# Patient Record
Sex: Male | Born: 1965 | Race: White | Hispanic: No | Marital: Married | Smoking: Never smoker
Health system: Southern US, Community
[De-identification: ages and names within clinical notes are randomized; demographics above are authoritative.]

## PROBLEM LIST (undated history)

## (undated) DIAGNOSIS — H409 Unspecified glaucoma: Secondary | ICD-10-CM

## (undated) DIAGNOSIS — N189 Chronic kidney disease, unspecified: Secondary | ICD-10-CM

## (undated) HISTORY — DX: Unspecified glaucoma: H40.9

## (undated) HISTORY — PX: TONSILLECTOMY: SUR1361

---
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
## 1999-08-20 ENCOUNTER — Encounter: Admission: RE | Admit: 1999-08-20 | Discharge: 1999-08-20 | Payer: Self-pay | Admitting: Neurosurgery

## 1999-08-20 ENCOUNTER — Encounter: Payer: Self-pay | Admitting: Neurosurgery

## 1999-08-26 ENCOUNTER — Ambulatory Visit (HOSPITAL_COMMUNITY): Admission: RE | Admit: 1999-08-26 | Discharge: 1999-08-27 | Payer: Self-pay | Admitting: Neurosurgery

## 2007-09-06 ENCOUNTER — Emergency Department (HOSPITAL_COMMUNITY): Admission: EM | Admit: 2007-09-06 | Discharge: 2007-09-06 | Payer: Self-pay | Admitting: Emergency Medicine

## 2010-08-13 NOTE — Op Note (Signed)
[place]  Patient:    Clinton Hanna, Clinton Hanna                        MRN: [id number] Proc. Date: 08/26/99 Adm. Date:  [id number] Disc. Date: [id number] Attending:  Jackelyn Knife                           Operative Report  PREOPERATIVE DIAGNOSIS:  Ventriculoperitoneal shunt malfunction.  POSTOPERATIVE DIAGNOSIS:  Ventriculoperitoneal shunt malfunction.  OPERATION PERFORMED:  Shunt revision.  SURGEON:  Izell Climax. Elesa Hacker, M.D.  ASSISTANT:  Clydene Fake, M.D.  ANESTHESIA:  General endotracheal.  DESCRIPTION OF PROCEDURE:  Under general endotracheal anesthesia, this man was positioned supine on the operating table.  It was elected to prep as though the entire shunt mechanism might need to be replaced and this was done.  The procedure was started with an exploration of a connector site for the peritoneal catheter since it had been apparently disconnected here as seen on preliminary x-rays.  The old proximal portion shunt tubing was easily found and indeed was noted to be disconnected from the distal peritoneal catheter. Initially, it was felt that there was some question as to whether there was adequate CSF flow, so the pumping mechanism and bur hole area were re-exposed and scar tissue was removed and the mechanisms were then shown to work properly with good CSF flow.  This incision was then closed.  Distally, a second incision was made allowing access to the peritoneal cavity. Dissection was carried down through subcutaneous tissues and fascial layers to expose peritoneum.  Particular care was taken to be sure that the entrance was indeed into the peritoneal cavity, not into preperitoneal fat and fascia.  The new peritoneal catheter was then attached to the tunneling connector with a 0 silk tie.  The new catheter was then tunneled to the peritoneal incision and then placed into the peritoneum.  A 3-0 silk pursestring suture had been placed and  this was now snugged down.  The abdominal wounds were closed in layers and sterile dressings were applied throughout.  The patient was taken to the recovery room in good condition. DD:  08/26/99 TD:  08/30/99 Job: [id number]

## 2010-08-13 NOTE — H&P (Signed)
[place]  Patient:    Clinton Hanna, Clinton Hanna                        MRN: [id number] Adm. Date:  [id number] Attending:  Jackelyn Knife                         History and Physical  HISTORY OF PRESENT ILLNESS:  Clinton Hanna, Hommel. is now 45 years of age.  He has been a patient of mine for the last 20+ years because of a problem of hydrocephalus following a pineal region tumor.  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  He has had revisions of the distal portion of the shunt but the ventricular portion has stayed opened.  We have followed him with yearly scans and appointments.  He came in on Aug 20, 1999, with a problem of increased frequency to his headaches over the last two weeks.  This was clearly a change.  He also had had some numbness of his left hand.  He has had this in the past.  Examination on the 25th looked reasonably good.  We did go ahead with repeat CT at that point and this showed progression of his hydrocephalus.  A shunt series of x-rays were done and this showed discontinuity of the peritoneal portion of the shunt where the tubing enters the abdomen.  He is admitted at this time for shunt revision.  PAST MEDICAL HISTORY:  His past medical history is pertinent as mentioned above.  FAMILY HISTORY:  Indicates that his mother and father are both living and well.  His father has also been a patient of ours.  PAST SURGICAL HISTORY:  Previous surgery includes shunt revision in the distal portion of the shunt on two or three occasions.  He has had a problem with the plastic eye as mentioned.  CURRENT MEDICATIONS:  He has been taking some analgesics.  REVIEW OF SYSTEMS:  Pertinent as already mentioned.  PHYSICAL EXAMINATION:  GENERAL:   On examination he is alert and cooperative.  He has gained a bit of weight and is slightly overweight at this time.  HEENT:  Examination of the head reveals the presence of a left ocular prosthesis.  The shunt mechanism is palpable and the right parietal occipital area.  CHEST:  Clear.  CARDIAC:  Examination is normal.  ABDOMEN:  Obese, soft, and nontender.  He does have surgical scars from shunt placement.  NEUROLOGICAL:  Examination at this time reveals sensorium and cranial nerves to be intact, and I had some difficulty seeing the margin in his right eye, but I think this had to do with pupillary size.  Strength is good and reflex and gait coordination are normal.  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   DD:  08/26/99 TD:  08/27/99 Job: [id number]

## 2010-12-23 LAB — URINALYSIS, ROUTINE W REFLEX MICROSCOPIC
Bilirubin Urine: NEGATIVE
Glucose, UA: NEGATIVE
Hgb urine dipstick: NEGATIVE
Ketones, ur: NEGATIVE
Nitrite: NEGATIVE
Protein, ur: NEGATIVE
Specific Gravity, Urine: 1.03
Urobilinogen, UA: 1
pH: 6.5

## 2010-12-23 LAB — CBC
HCT: 45
Hemoglobin: 15
MCHC: 33.4
MCV: 91.1
Platelets: 207
RBC: 4.94
RDW: 13.4
WBC: 10

## 2012-09-12 ENCOUNTER — Emergency Department: Payer: Self-pay

## 2012-09-12 LAB — COMPREHENSIVE METABOLIC PANEL
Albumin: 3.6 g/dL (ref 3.4–5.0)
Alkaline Phosphatase: 73 U/L (ref 50–136)
Anion Gap: 2 — ABNORMAL LOW (ref 7–16)
BUN: 13 mg/dL (ref 7–18)
Bilirubin,Total: 0.5 mg/dL (ref 0.2–1.0)
Calcium, Total: 8.6 mg/dL (ref 8.5–10.1)
Chloride: 108 mmol/L — ABNORMAL HIGH (ref 98–107)
EGFR (Non-African Amer.): >60
Glucose: 103 mg/dL — ABNORMAL HIGH (ref 65–99)
Osmolality: 278 (ref 275–301)
Potassium: 4 mmol/L (ref 3.5–5.1)
SGOT(AST): 21 U/L (ref 15–37)
Sodium: 139 mmol/L (ref 136–145)

## 2012-09-12 LAB — CBC
MCHC: 33.7 g/dL (ref 32.0–36.0)
MCV: 89 fL (ref 80–100)
RDW: 13.9 % (ref 11.5–14.5)
WBC: 6.2 10*3/uL (ref 3.8–10.6)

## 2014-07-18 NOTE — Consult Note (Signed)
PATIENT NAME:  Clinton Hanna, Witt MR#:  [id number] DATE OF BIRTH:  02-01-1966  DATE OF CONSULTATION:  09/12/2012  CONSULTING PHYSICIAN:  Hoang Reich A. Allena KatzPatel, MD PRIMARY CARE PHYSICIAN: Unknown   CHIEF COMPLAINT: Right-sided headache with blurred vision today.   HISTORY OF PRESENT ILLNESS:  Mr. Clinton Hanna is a 49 year old Caucasian gentleman who has history of VP shunt since 1978 when he was diagnosed with a pineal tumor. He had undergone a VP shunt placement and thereafter procedures for his shunt blockage several times in the past.   He comes in today after he started having a right-sided headache and associated with some "fuzzy blurred vision." The patient said he felt some numbness in his right upper extremity.  He denies any slurred speech or any difficulty swallowing or any weakness in his arms or legs. He came to the Emergency Room, received aspirin 324 mg p.o. once and a Toradol injection. The patient states his headache is resolved. His vision is stable, and his vision is at baseline, and his right arm numbness is also resolved. The patient is hemodynamically otherwise stable, denies any nausea or vomiting.   PAST MEDICAL HISTORY: 1.  History of pineal tumor, status post VP shunt in 1978. The patient follows up with [facility] [address] in [place].  2.  Left prosthetic eye.   ALLERGIES: No known drug allergies.   FAMILY HISTORY: Grandmother with stroke.   SOCIAL HISTORY: Married, nonsmoker, nonalcoholic.  Denies any other drug use.   REVIEW OF SYSTEMS:  CONSTITUTIONAL: No fever, fatigue, weakness.  EYES: The patient did have blurred vision initially, now resolved. No glaucoma or cataracts.  ENT: No tinnitus, ear pain, hearing loss.  RESPIRATORY: No cough, wheeze, hemoptysis.  CARDIOVASCULAR: No chest pain, orthopnea, edema or hypertension.   GASTROINTESTINAL: No nausea, vomiting, diarrhea, abdominal pain or hematemesis.  GENITOURINARY: No dysuria or hematuria.  ENDOCRINE:  No polyuria, nocturia or thyroid problems.  HEMATOLOGY: No anemia or easy bruising.  SKIN: No acne or rash.  MUSCULOSKELETAL: No arthritis or gout. NEUROLOGICAL: No CVA, TIA, no dysarthria.  PSYCHIATRIC: No anxiety or depression.  All other systems reviewed and negative.   PHYSICAL EXAMINATION: GENERAL: The patient is awake, alert, oriented x 3, not in acute distress.  VITAL SIGNS: Afebrile, pulse is 81, blood pressure is 113/65, sats are 96% on room air.  HEENT: Atraumatic, normocephalic. PERRLA on right eye. Left eye is prosthetic. Oral mucosa is moist. No facial droop.  NECK: Supple. No JVD. No carotid bruit.  RESPIRATORY: Clear to auscultation bilaterally. No rales, rhonchi, respiratory distress or labored breathing.  CARDIOVASCULAR: Both the heart sounds are normal. Rate, rhythm regular. PMI not lateralized. Chest is nontender.  EXTREMITIES: Good pedal pulses, good femoral pulses. No lower extremity edema.  ABDOMEN: Soft, benign, nontender. No organomegaly. Positive bowel sounds.  NEUROLOGIC: Cranial nerves II through XII appear intact. Motor and sensory exam appears within normal limits. The patient has good strength 5 out of 5 in all 4extremities. Sensory exam is normal. Gait is normal. Speech clear.  SKIN: Warm and dry.  PSYCHIATRIC: The patient is awake, alert, oriented x 3.   LABORATORY AND RADIOLOGICAL DATA:  CBC within normal limits except H and H of 12.7 and 37.7.  Comprehensive metabolic panel within normal limits.  CT head shows no evidence of acute focal abnormalities. Ventriculoperitoneal shunt is appreciated by a posterior parietal approach with tip projecting in the left lateral ventricle. Ventricles and cisterns are comprised.  No evidence of subfalcine or tonsillar herniation.  ASSESSMENT: [name] is a 49 year old with history of VP shunt in the past, comes in with:   1.  Right-sided headache with mild blurred vision and right hand numbness which is resolved after  the patient had aspirin 324 p.o. once and IV Toradol injection. No neuro deficits noted. CT head is negative for CVA. VP shunt appears intact without any evidence of ventriculomegaly. The patient does not have any risk factors of CVA other than family history of grandmother with CVA. It appears to be likely complex headache with blurred vision which is resolved after treatment in the Emergency Room. The patient denies any vomiting.   2.  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   PLAN:  1.  Discharge the patient to go home. He feels near baseline. The patient will continue baby aspirin daily. He will take p.r.n. Tylenol for headache, if needed.  2.  The patient was also recommended followup with his neurologist in [place], and he is recommended to return back to the Emergency Room or acute urgent care is signs and symptoms worsen.    The above was discussed with the patient's wife, who voiced understanding.   TIME SPENT: 45 minutes.   ____________________________ [name], MD sap:cb D: 09/12/2012 19:09:28 ET T: 09/12/2012 20:18:16 ET JOB#: [id number]  cc: Brelan Hannen A. Allena Katz, MD, <Dictator> Willow Ora MD ELECTRONICALLY SIGNED 09/19/2012 14:23

## 2014-09-12 DIAGNOSIS — N200 Calculus of kidney: Secondary | ICD-10-CM | POA: Diagnosis not present

## 2014-09-12 DIAGNOSIS — R109 Unspecified abdominal pain: Secondary | ICD-10-CM | POA: Diagnosis present

## 2014-09-13 ENCOUNTER — Emergency Department: Payer: BLUE CROSS/BLUE SHIELD

## 2014-09-13 ENCOUNTER — Encounter: Payer: Self-pay | Admitting: Emergency Medicine

## 2014-09-13 ENCOUNTER — Emergency Department
Admission: EM | Admit: 2014-09-13 | Discharge: 2014-09-13 | Disposition: A | Discharge status: Home / Self Care | Payer: BLUE CROSS/BLUE SHIELD | Attending: Emergency Medicine | Admitting: Emergency Medicine

## 2014-09-13 DIAGNOSIS — R52 Pain, unspecified: Secondary | ICD-10-CM

## 2014-09-13 DIAGNOSIS — N200 Calculus of kidney: Secondary | ICD-10-CM

## 2014-09-13 LAB — CBC WITH DIFFERENTIAL/PLATELET
BASOS PCT: 1 %
Basophils Absolute: 0 10*3/uL (ref 0–0.1)
Eosinophils Absolute: 0.1 10*3/uL (ref 0–0.7)
Eosinophils Relative: 1 %
HCT: 41 % (ref 40.0–52.0)
Hemoglobin: 13.4 g/dL (ref 13.0–18.0)
LYMPHS ABS: 2.3 10*3/uL (ref 1.0–3.6)
Lymphocytes Relative: 31 %
MCH: 30.1 pg (ref 26.0–34.0)
MCHC: 32.5 g/dL (ref 32.0–36.0)
MCV: 92.7 fL (ref 80.0–100.0)
MONOS PCT: 7 %
Monocytes Absolute: 0.5 10*3/uL (ref 0.2–1.0)
NEUTROS ABS: 4.6 10*3/uL (ref 1.4–6.5)
NEUTROS PCT: 60 %
Platelets: 206 10*3/uL (ref 150–440)
RBC: 4.43 MIL/uL (ref 4.40–5.90)
RDW: 14.2 % (ref 11.5–14.5)
WBC: 7.6 10*3/uL (ref 3.8–10.6)

## 2014-09-13 LAB — URINALYSIS COMPLETE WITH MICROSCOPIC (ARMC ONLY)
BACTERIA UA: NONE SEEN
Bilirubin Urine: NEGATIVE
Glucose, UA: NEGATIVE mg/dL
Ketones, ur: NEGATIVE mg/dL
LEUKOCYTES UA: NEGATIVE
Nitrite: NEGATIVE
PH: 5 (ref 5.0–8.0)
PROTEIN: NEGATIVE mg/dL
SQUAMOUS EPITHELIAL / LPF: NONE SEEN
Specific Gravity, Urine: 1.023 (ref 1.005–1.030)

## 2014-09-13 LAB — COMPREHENSIVE METABOLIC PANEL
ALBUMIN: 4.2 g/dL (ref 3.5–5.0)
ALK PHOS: 69 U/L (ref 38–126)
ALT: 27 U/L (ref 17–63)
ANION GAP: 5 (ref 5–15)
AST: 28 U/L (ref 15–41)
BUN: 20 mg/dL (ref 6–20)
CO2: 28 mmol/L (ref 22–32)
Calcium: 9 mg/dL (ref 8.9–10.3)
Chloride: 107 mmol/L (ref 101–111)
Creatinine, Ser: 1.21 mg/dL (ref 0.61–1.24)
GFR calc Af Amer: >60 mL/min (ref >60)
GFR calc non Af Amer: >60 mL/min (ref >60)
Glucose, Bld: 107 mg/dL — ABNORMAL HIGH (ref 65–99)
POTASSIUM: 3.6 mmol/L (ref 3.5–5.1)
SODIUM: 140 mmol/L (ref 135–145)
TOTAL PROTEIN: 7.7 g/dL (ref 6.5–8.1)
Total Bilirubin: 0.6 mg/dL (ref 0.3–1.2)

## 2014-09-13 LAB — TROPONIN I: Troponin I: <0.03 ng/mL (ref <0.031)

## 2014-09-13 MED ORDER — FENTANYL CITRATE (PF) 100 MCG/2ML IJ SOLN
INTRAMUSCULAR | Status: AC
  Administered 2014-09-13: 50 ug via INTRAVENOUS
  Filled 2014-09-13: qty 2

## 2014-09-13 MED ORDER — TAMSULOSIN HCL 0.4 MG PO CAPS: 0.4000 mg | ORAL_CAPSULE | Freq: Every day | ORAL | Status: DC

## 2014-09-13 MED ORDER — ONDANSETRON HCL 4 MG/2ML IJ SOLN
4.0000 mg | Freq: Once | INTRAMUSCULAR | Status: AC
  Administered 2014-09-13: 4 mg via INTRAVENOUS

## 2014-09-13 MED ORDER — OXYCODONE-ACETAMINOPHEN 5-325 MG PO TABS: 1.0000 | ORAL_TABLET | Freq: Four times a day (QID) | ORAL | Status: AC | PRN

## 2014-09-13 MED ORDER — FENTANYL CITRATE (PF) 100 MCG/2ML IJ SOLN
50.0000 ug | Freq: Once | INTRAMUSCULAR | Status: AC
  Administered 2014-09-13: 50 ug via INTRAVENOUS

## 2014-09-13 MED ORDER — FENTANYL CITRATE (PF) 100 MCG/2ML IJ SOLN
INTRAMUSCULAR | Status: AC
  Filled 2014-09-13: qty 2

## 2014-09-13 MED ORDER — ONDANSETRON HCL 4 MG/2ML IJ SOLN
INTRAMUSCULAR | Status: AC
  Administered 2014-09-13: 4 mg via INTRAVENOUS
  Filled 2014-09-13: qty 2

## 2014-09-13 MED ORDER — TAMSULOSIN HCL 0.4 MG PO CAPS
ORAL_CAPSULE | ORAL | Status: AC
  Administered 2014-09-13: 0.4 mg via ORAL
  Filled 2014-09-13: qty 1

## 2014-09-13 MED ORDER — TAMSULOSIN HCL 0.4 MG PO CAPS
0.4000 mg | ORAL_CAPSULE | Freq: Once | ORAL | Status: AC
  Administered 2014-09-13: 0.4 mg via ORAL

## 2014-09-13 NOTE — ED Provider Notes (Signed)
[facility] Emergency Department Provider Note  ____________________________________________  Time seen: Approximately 320 AM  I have reviewed the triage vital signs and the nursing notes.   HISTORY  Chief Complaint Flank Pain    HPI Clinton Hanna is a 48 y.o. male with a remote history of a ventricular peritoneal shunt secondary to brain mass as a child presents with left-sided flank pain. Says that the pain started all of a sudden today in his left lumbar area and radiated around to the left lower quadrant. He had nausea and vomiting. Denies any pain in the penis or testicles. No hematuria visible. No dysuria.   No past medical history on file.  There are no active problems to display for this patient.   No past surgical history on file.  No current outpatient prescriptions on file.  Allergies Review of patient's allergies indicates no known allergies.  History reviewed. No pertinent family history.  Social History History  Substance Use Topics  . Smoking status: Never Smoker   . Smokeless tobacco: Not on file  . Alcohol Use: Yes     Comment: occasionaly    Review of Systems Constitutional: No fever/chills Eyes: No visual changes. ENT: No sore throat. Cardiovascular: Denies chest pain. Respiratory: Denies shortness of breath. Gastrointestinal: As above  Genitourinary: Negative for dysuria. Musculoskeletal: As above Skin: Negative for rash. Neurological: Negative for headaches, focal weakness or numbness.  10-point ROS otherwise negative.  ____________________________________________   PHYSICAL EXAM:  VITAL SIGNS: ED Triage Vitals  Enc Vitals Group     BP 09/12/14 2358 135/95 mmHg     Pulse Rate 09/12/14 2358 96     Resp 09/12/14 2358 22     Temp 09/12/14 2358 98.3 F (36.8 C)     Temp Source 09/12/14 2358 Oral     SpO2 09/12/14 2358 99 %     Weight 09/12/14 2358 240 lb (108.863 kg)     Height 09/12/14 2358 5\' 11"  (1.803  m)     Head Cir --      Peak Flow --      Pain Score 09/13/14 0005 9     Pain Loc --      Pain Edu? --      Excl. in GC? --     Constitutional: Alert and oriented. Well appearing and in no acute distress. Eyes: Conjunctivae are normal. PERRL. EOMI. Head: Atraumatic. Nose: No congestion/rhinnorhea. Mouth/Throat: Mucous membranes are moist.  Oropharynx non-erythematous. Neck: No stridor.   Cardiovascular: Normal rate, regular rhythm. Grossly normal heart sounds.  Good peripheral circulation. Respiratory: Normal respiratory effort.  No retractions. Lungs CTAB. Gastrointestinal: Soft and nontender. No distention. No abdominal bruits. No CVA tenderness. Musculoskeletal: No lower extremity tenderness nor edema.  No joint effusions. Neurologic:  Normal speech and language. No gross focal neurologic deficits are appreciated. Speech is normal. No gait instability. Skin:  Skin is warm, dry and intact. No rash noted. Psychiatric: Mood and affect are normal. Speech and behavior are normal.  ____________________________________________   LABS (all labs ordered are listed, but only abnormal results are displayed)  Labs Reviewed  COMPREHENSIVE METABOLIC PANEL - Abnormal; Notable for the following:    Glucose, Bld 107 (*)    All other components within normal limits  URINALYSIS COMPLETEWITH MICROSCOPIC ([facility] ONLY) - Abnormal; Notable for the following:    Color, Urine YELLOW (*)    APPearance CLEAR (*)    Hgb urine dipstick 3+ (*)    All other components  within normal limits  CBC WITH DIFFERENTIAL/PLATELET  TROPONIN I   ____________________________________________  EKG   ____________________________________________  RADIOLOGY  Acute obstructive uropathy on the left with a punctate calculus at the left UVJ. ____________________________________________   PROCEDURES    ____________________________________________   INITIAL IMPRESSION / ASSESSMENT AND PLAN / ED  COURSE  Pertinent labs & imaging results that were available during my care of the patient were reviewed by me and considered in my medical decision making (see chart for details).  ----------------------------------------- 3:48 AM on 09/13/2014 -----------------------------------------  Patient resting comfortably after morphine and Zofran. Small stone. Likely to pass on its own. Labs reassuring. We'll discharge to home with Flomax and Percocet. To follow up with urology. Also to give a urine strainer. Excellent diagnosis and imaging to family as well as the patient. ____________________________________________   FINAL CLINICAL IMPRESSION(S) / ED DIAGNOSES  Acute left-sided kidney stone. Initial visit.    Myrna Blazer, MD 09/13/14 [phone]

## 2014-09-13 NOTE — ED Notes (Signed)
Patient with sudden onset of left flank pain that started this afternoon. Patient with nausea and vomiting. Denies urinary symptoms.

## 2014-10-06 ENCOUNTER — Ambulatory Visit (INDEPENDENT_AMBULATORY_CARE_PROVIDER_SITE_OTHER): Payer: BLUE CROSS/BLUE SHIELD | Admitting: Urology

## 2014-10-06 VITALS — BP 103/66 | HR 94 | Ht 71.0 in | Wt 254.2 lb

## 2014-10-06 DIAGNOSIS — N201 Calculus of ureter: Secondary | ICD-10-CM | POA: Insufficient documentation

## 2014-10-06 DIAGNOSIS — N200 Calculus of kidney: Secondary | ICD-10-CM

## 2014-10-06 LAB — URINALYSIS, COMPLETE
Bilirubin, UA: NEGATIVE
Glucose, UA: NEGATIVE
KETONES UA: NEGATIVE
LEUKOCYTES UA: NEGATIVE
Nitrite, UA: NEGATIVE
Protein, UA: NEGATIVE
RBC UA: NEGATIVE
Specific Gravity, UA: 1.02 (ref 1.005–1.030)
UUROB: 0.2 mg/dL (ref 0.2–1.0)
pH, UA: 7 (ref 5.0–7.5)

## 2014-10-06 LAB — MICROSCOPIC EXAMINATION: Bacteria, UA: NONE SEEN

## 2014-10-06 NOTE — Progress Notes (Signed)
Patient ID: Clinton Hanna, male   DOB: [birth year], 49 y.o.   MRN: [id number]  Subjective: Clinton Hanna is a 49 yo WM who had the onset on 6/18 of left flank pain that was severe but intermittent.  He had a CT that showed a 1mm LUVJ stone.  He had a CT in 2009 that showed a 2-56mm right renal stone but it is no longer present and doesn't recall passing that.   The last time he had pain was on 6/18 after the ER visit.  He remains on tamsulosin. He has had no other GU history.  ROS:  Review of Systems  Genitourinary: Frequency: with nocturia x 2 for the last several years.  All other systems reviewed and are negative.   No Known Allergies  Outpatient Encounter Prescriptions as of 10/06/2014  Medication Sig Note  . tamsulosin (FLOMAX) 0.4 MG CAPS capsule Take 1 capsule (0.4 mg total) by mouth daily.   Marland Kitchen HYDROcodone-homatropine (HYCODAN) 5-1.5 MG/5ML syrup Take by mouth. 10/06/2014: Received from: [facility]  . oxyCODONE-acetaminophen (ROXICET) 5-325 MG per tablet Take 1-2 tablets by mouth every 6 (six) hours as needed. (Patient not taking: Reported on 10/06/2014) 10/06/2014: Patient states hasn't had to take any yet.   No facility-administered encounter medications on file as of 10/06/2014.    Past Medical History  Diagnosis Date  . Glaucoma     Past Surgical History  Procedure Laterality Date  . Shunt revision  1977    1986  . Eye surgery  1976    1983  . Tonsillectomy      History   Social History  . Marital Status: Married    Spouse Name: N/A  . Number of Children: N/A  . Years of Education: N/A   Occupational History  . Not on file.   Social History Main Topics  . Smoking status: Never Smoker   . Smokeless tobacco: Not on file  . Alcohol Use: Yes     Comment: occasionaly  . Drug Use: No  . Sexual Activity: Not on file   Other Topics Concern  . Not on file   Social History Narrative    Family History  Problem Relation Age of Onset  . Kidney cancer Neg  Hx   . Prostate cancer Neg Hx   . Bladder Cancer Neg Hx        Objective: Filed Vitals:   10/06/14 1603  BP: 103/66  Pulse: 94     Physical Exam  Constitutional: He is oriented to person, place, and time and well-developed, well-nourished, and in no distress.  HENT:  Head: Normocephalic and atraumatic.  Neck: Normal range of motion. Neck supple.  Cardiovascular: Normal rate, regular rhythm and normal heart sounds.   Pulmonary/Chest: Effort normal and breath sounds normal. No respiratory distress.  Abdominal: Soft. He exhibits no mass. There is tenderness (chronic in lower abdomen from his VP shunt). There is no guarding.  Musculoskeletal: Normal range of motion. He exhibits no edema or tenderness.  Neurological: He is alert and oriented to person, place, and time.  Skin: Skin is warm and dry.  Psychiatric: Mood and affect normal.    Lab Results:  No results for input(s): WBC, HGB, HCT, PLT in the last 72 hours. BMET No results for input(s): NA, K, CL, CO2, GLUCOSE, BUN, CREATININE, CALCIUM in the last 72 hours. PT/INR No results for input(s): LABPROT, INR in the last 72 hours. ABG No results for input(s): PHART, HCO3 in the last  72 hours.  Invalid input(s): PCO2, PO2  Urine dipstick shows negative for all components.  Micro exam: negative for WBC's or RBC's.  Studies/Results: No results found. CT films and ER notes reviewed.  Assessment: He had a small left UVJ stone which he appears to have passed. He has some chronic frequency.   Plan: Stop Tamsulosin. I have discussed avoiding caffeine and tea to see if that will help his frequency.   CC: [facility] in [place].      Dymond Gutt J 10/06/2014

## 2016-06-03 ENCOUNTER — Encounter: Payer: Self-pay | Admitting: *Deleted

## 2016-06-06 ENCOUNTER — Encounter
Admission: RE | Disposition: A | Discharge status: Home / Self Care | Payer: Self-pay | Source: Ambulatory Visit | Attending: Gastroenterology

## 2016-06-06 ENCOUNTER — Encounter: Payer: Self-pay | Admitting: *Deleted

## 2016-06-06 ENCOUNTER — Ambulatory Visit: Payer: BLUE CROSS/BLUE SHIELD | Admitting: Anesthesiology

## 2016-06-06 ENCOUNTER — Ambulatory Visit
Admission: RE | Admit: 2016-06-06 | Discharge: 2016-06-06 | Disposition: A | Discharge status: Home / Self Care | Payer: BLUE CROSS/BLUE SHIELD | Source: Ambulatory Visit | Attending: Gastroenterology | Admitting: Gastroenterology

## 2016-06-06 DIAGNOSIS — N189 Chronic kidney disease, unspecified: Secondary | ICD-10-CM | POA: Insufficient documentation

## 2016-06-06 DIAGNOSIS — K635 Polyp of colon: Secondary | ICD-10-CM | POA: Insufficient documentation

## 2016-06-06 DIAGNOSIS — Q439 Congenital malformation of intestine, unspecified: Secondary | ICD-10-CM | POA: Diagnosis not present

## 2016-06-06 DIAGNOSIS — Z1211 Encounter for screening for malignant neoplasm of colon: Secondary | ICD-10-CM | POA: Insufficient documentation

## 2016-06-06 HISTORY — PX: COLONOSCOPY WITH PROPOFOL: SHX5780

## 2016-06-06 HISTORY — DX: Chronic kidney disease, unspecified: N18.9

## 2016-06-06 SURGERY — COLONOSCOPY WITH PROPOFOL
Anesthesia: General

## 2016-06-06 MED ORDER — MIDAZOLAM HCL 2 MG/2ML IJ SOLN
INTRAMUSCULAR | Status: AC
  Filled 2016-06-06: qty 2

## 2016-06-06 MED ORDER — FENTANYL CITRATE (PF) 100 MCG/2ML IJ SOLN
INTRAMUSCULAR | Status: DC | PRN
  Administered 2016-06-06: 25 ug via INTRAVENOUS

## 2016-06-06 MED ORDER — PHENYLEPHRINE HCL 10 MG/ML IJ SOLN
INTRAMUSCULAR | Status: DC | PRN
  Administered 2016-06-06: 50 ug via INTRAVENOUS

## 2016-06-06 MED ORDER — PROPOFOL 10 MG/ML IV BOLUS
INTRAVENOUS | Status: DC | PRN
  Administered 2016-06-06: 70 mg via INTRAVENOUS
  Administered 2016-06-06: 10 mg via INTRAVENOUS

## 2016-06-06 MED ORDER — MIDAZOLAM HCL 2 MG/2ML IJ SOLN
INTRAMUSCULAR | Status: DC | PRN
  Administered 2016-06-06: 1 mg via INTRAVENOUS

## 2016-06-06 MED ORDER — SODIUM CHLORIDE 0.9 % IV SOLN: INTRAVENOUS | Status: DC

## 2016-06-06 MED ORDER — ONDANSETRON HCL 4 MG/2ML IJ SOLN
INTRAMUSCULAR | Status: AC
  Administered 2016-06-06: 4 mg via INTRAVENOUS
  Filled 2016-06-06: qty 2

## 2016-06-06 MED ORDER — FENTANYL CITRATE (PF) 100 MCG/2ML IJ SOLN
INTRAMUSCULAR | Status: AC
  Filled 2016-06-06: qty 2

## 2016-06-06 MED ORDER — LIDOCAINE HCL (PF) 2 % IJ SOLN
INTRAMUSCULAR | Status: AC
  Filled 2016-06-06: qty 2

## 2016-06-06 MED ORDER — ONDANSETRON HCL 4 MG/2ML IJ SOLN
4.0000 mg | Freq: Once | INTRAMUSCULAR | Status: AC
  Administered 2016-06-06: 4 mg via INTRAVENOUS

## 2016-06-06 MED ORDER — PROPOFOL 500 MG/50ML IV EMUL
INTRAVENOUS | Status: AC
  Filled 2016-06-06: qty 50

## 2016-06-06 MED ORDER — PROPOFOL 500 MG/50ML IV EMUL
INTRAVENOUS | Status: DC | PRN
  Administered 2016-06-06: 150 ug/kg/min via INTRAVENOUS

## 2016-06-06 MED ORDER — SODIUM CHLORIDE 0.9 % IV SOLN
INTRAVENOUS | Status: DC
  Administered 2016-06-06: 1000 mL via INTRAVENOUS

## 2016-06-06 MED ORDER — LIDOCAINE HCL (PF) 2 % IJ SOLN
INTRAMUSCULAR | Status: DC | PRN
  Administered 2016-06-06: 40 mg via INTRADERMAL

## 2016-06-06 NOTE — Op Note (Signed)
[facility] Gastroenterology Patient Name: Clinton OpitzBobby Vitug Procedure Date: 06/06/2016 1:32 PM MRN: [id number] Account #: [id number] Date of Birth: Oct 12, 1965 Admit Type: Outpatient Age: 51 Room: [facility] ENDO ROOM 1 Gender: Male Note Status: Finalized Procedure:            Colonoscopy Providers:            Christena DeemMartin U. Keonia Pasko, MD Complications:        No immediate complications. Procedure:            Pre-Anesthesia Assessment:                       - ASA Grade Assessment: III - A patient with severe                        systemic disease.                       After obtaining informed consent, the colonoscope was                        passed under direct vision. Throughout the procedure,                        the patient's blood pressure, pulse, and oxygen                        saturations were monitored continuously. The                        Colonoscope was introduced through the anus with the                        intention of advancing to the cecum. The scope was                        advanced to the splenic flexure before the procedure                        was aborted. Medications were given. The colonoscopy                        was unusually difficult due to restricted mobility of                        the colon. Findings:      The scope was carefully advanced to about 50--55 cm on the scope. The       sigmoid was fairly tortuous. At about 55 cm there is a sharp angulation       and possible stenosis that I was unable to go beyond.      A 2 mm polyp was found in the descending colon. The polyp was sessile.       The polyp was removed with a cold biopsy forceps. Resection and       retrieval were complete.      Three sessile polyps were found in the distal sigmoid colon. The polyps       were 1 to 2 mm in size. These polyps were removed with a cold biopsy       forceps. Resection and retrieval were complete.  The digital rectal exam was normal.  The digital rectal exam was normal. Impression:           - One 2 mm polyp in the descending colon, removed with                        a cold biopsy forceps. Resected and retrieved.                       - Three 1 to 2 mm polyps in the distal sigmoid colon,                        removed with a cold biopsy forceps. Resected and                        retrieved. Recommendation:       - Discharge patient to home.                       - Await pathology results. Procedure Code(s):    --- Professional ---                       [phone], 52, Colonoscopy, flexible; with biopsy, single                        or multiple Diagnosis Code(s):    --- Professional ---                       D12.4, Benign neoplasm of descending colon                       D12.5, Benign neoplasm of sigmoid colon CPT copyright 2016 American Medical Association. All rights reserved. The codes documented in this report are preliminary and upon coder review may  be revised to meet current compliance requirements. Christena Deem, MD 06/06/2016 2:13:28 PM This report has been signed electronically. Number of Addenda: 0 Note Initiated On: 06/06/2016 1:32 PM Total Procedure Duration: 0 hours 17 minutes 20 seconds       [facility]

## 2016-06-06 NOTE — Anesthesia Post-op Follow-up Note (Cosign Needed)
Anesthesia QCDR form completed.        

## 2016-06-06 NOTE — Anesthesia Postprocedure Evaluation (Signed)
Anesthesia Post Note  Patient: Clinton PoliteBobby G Marner  Procedure(s) Performed: Procedure(s) (LRB): COLONOSCOPY WITH PROPOFOL (N/A)  Patient location during evaluation: PACU Anesthesia Type: General Level of consciousness: awake and alert and oriented Pain management: pain level controlled Vital Signs Assessment: post-procedure vital signs reviewed and stable Respiratory status: spontaneous breathing Cardiovascular status: blood pressure returned to baseline Anesthetic complications: no     Last Vitals:  Vitals:   06/06/16 1410 06/06/16 1411  BP: (!) 89/56 95/63  Pulse: 87 62  Resp: 16 14  Temp: (!) 36.1 C (!) 36.1 C    Last Pain:  Vitals:   06/06/16 1411  TempSrc: Tympanic                 Alie Moudy

## 2016-06-06 NOTE — H&P (Addendum)
Outpatient short stay form Pre-procedure 06/06/2016 1:34 PM Clinton DeemMartin U Skulskie MD  Primary Physician: Dr. Maudie Flakesale Feldpausch  Reason for visit:  Colonoscopy  History of present illness:  Patient is a 51 year old male presenting today as above. Since his first colonoscopy. He tolerated his prep well. He takes no aspirin or blood thinning agents.    Current Facility-Administered Medications:  .  0.9 %  sodium chloride infusion, , Intravenous, Continuous, Clinton DeemMartin U Skulskie, MD, Last Rate: 20 mL/hr at 06/06/16 1257, 1,000 mL at 06/06/16 1257 .  0.9 %  sodium chloride infusion, , Intravenous, Continuous, Clinton DeemMartin U Skulskie, MD  Prescriptions Prior to Admission  Medication Sig Dispense Refill Last Dose  . benzonatate (TESSALON) 100 MG capsule Take 100 mg by mouth 3 (three) times daily as needed for cough.     Marland Kitchen. HYDROcodone-homatropine (HYCODAN) 5-1.5 MG/5ML syrup Take by mouth.   Not Taking  . oxyCODONE-acetaminophen (ROXICET) 5-325 MG per tablet Take 1-2 tablets by mouth every 6 (six) hours as needed. (Patient not taking: Reported on 10/06/2014) 15 tablet 0 Not Taking     No Known Allergies   Past Medical History:  Diagnosis Date  . Chronic kidney disease   . Glaucoma   . Glaucoma     Review of systems:      Physical Exam    Heart and lungs: Regular rate and rhythm without rub or gallop, lungs are bilaterally clear.    HEENT: Normocephalic atraumatic eyes are anicteric    Other:     Pertinant exam for procedure: Soft nontender nondistended bowel sounds positive normoactive. Protuberant.    Planned proceedures: Colonoscopy and indicated procedures. I have discussed the risks benefits and complications of procedures to include not limited to bleeding, infection, perforation and the risk of sedation and the patient wishes to proceed.    Clinton DeemMartin U Skulskie, MD Gastroenterology 06/06/2016  1:34 PM

## 2016-06-06 NOTE — Anesthesia Preprocedure Evaluation (Addendum)
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 negative psych ROS   GI/Hepatic negative GI ROS, Neg liver ROS,   Endo/Other  negative endocrine ROS  Renal/GU Renal InsufficiencyRenal disease  negative genitourinary   Musculoskeletal negative musculoskeletal ROS (+)   Abdominal Normal abdominal exam  (+)   Peds negative pediatric ROS (+)  Hematology negative hematology ROS (+)   Anesthesia Other Findings   Reproductive/Obstetrics                            Anesthesia Physical Anesthesia Plan  ASA: III  Anesthesia Plan: General   Post-op Pain Management:    Induction: Intravenous  Airway Management Planned: Nasal Cannula  Additional Equipment:   Intra-op Plan:   Post-operative Plan:   Informed Consent: I have reviewed the patients History and Physical, chart, labs and discussed the procedure including the risks, benefits and alternatives for the proposed anesthesia with the patient or authorized representative who has indicated his/her understanding and acceptance.     Plan Discussed with: CRNA and Surgeon  Anesthesia Plan Comments:         Anesthesia Quick Evaluation

## 2016-06-06 NOTE — Anesthesia Procedure Notes (Signed)
Date/Time: 06/06/2016 1:40 PM Performed by: Karoline CaldwellSTARR, Britzy Graul Pre-anesthesia Checklist: Patient identified, Emergency Drugs available, Suction available and Patient being monitored Patient Re-evaluated:Patient Re-evaluated prior to inductionOxygen Delivery Method: Nasal cannula

## 2016-06-06 NOTE — Transfer of Care (Signed)
Immediate Anesthesia Transfer of Care Note  Patient: Clinton Hanna  Procedure(s) Performed: Procedure(s): COLONOSCOPY WITH PROPOFOL (N/A)  Patient Location: PACU  Anesthesia Type:General  Level of Consciousness: sedated  Airway & Oxygen Therapy: Patient Spontanous Breathing and Patient connected to nasal cannula oxygen  Post-op Assessment: Report given to RN and Post -op Vital signs reviewed and stable  Post vital signs: Reviewed and stable  Last Vitals:  Vitals:   06/06/16 1410 06/06/16 1411  BP: (!) (P) 89/56 95/63  Pulse:  62  Resp:  14  Temp: (!) (P) 36.1 C (!) 36.1 C    Last Pain:  Vitals:   06/06/16 1411  TempSrc: Tympanic         Complications: No apparent anesthesia complications

## 2016-06-08 LAB — SURGICAL PATHOLOGY

## 2016-07-07 ENCOUNTER — Other Ambulatory Visit: Payer: Self-pay | Admitting: Gastroenterology

## 2016-07-07 DIAGNOSIS — Z86010 Personal history of colonic polyps: Secondary | ICD-10-CM

## 2016-07-19 ENCOUNTER — Other Ambulatory Visit: Payer: BLUE CROSS/BLUE SHIELD

## 2016-09-21 ENCOUNTER — Encounter: Payer: Self-pay | Admitting: *Deleted

## 2016-09-21 ENCOUNTER — Emergency Department
Admission: EM | Admit: 2016-09-21 | Discharge: 2016-09-21 | Disposition: A | Discharge status: Home / Self Care | Payer: BLUE CROSS/BLUE SHIELD | Attending: Emergency Medicine | Admitting: Emergency Medicine

## 2016-09-21 ENCOUNTER — Emergency Department: Payer: BLUE CROSS/BLUE SHIELD

## 2016-09-21 DIAGNOSIS — R1030 Lower abdominal pain, unspecified: Secondary | ICD-10-CM | POA: Diagnosis present

## 2016-09-21 DIAGNOSIS — K529 Noninfective gastroenteritis and colitis, unspecified: Secondary | ICD-10-CM

## 2016-09-21 DIAGNOSIS — R1031 Right lower quadrant pain: Secondary | ICD-10-CM | POA: Diagnosis not present

## 2016-09-21 DIAGNOSIS — N189 Chronic kidney disease, unspecified: Secondary | ICD-10-CM | POA: Insufficient documentation

## 2016-09-21 LAB — COMPREHENSIVE METABOLIC PANEL
ALT: 28 U/L (ref 17–63)
ANION GAP: 6 (ref 5–15)
AST: 24 U/L (ref 15–41)
Albumin: 4.1 g/dL (ref 3.5–5.0)
Alkaline Phosphatase: 63 U/L (ref 38–126)
BUN: 15 mg/dL (ref 6–20)
CHLORIDE: 105 mmol/L (ref 101–111)
CO2: 25 mmol/L (ref 22–32)
Calcium: 9.2 mg/dL (ref 8.9–10.3)
Creatinine, Ser: 0.87 mg/dL (ref 0.61–1.24)
Glucose, Bld: 124 mg/dL — ABNORMAL HIGH (ref 65–99)
Potassium: 4.2 mmol/L (ref 3.5–5.1)
SODIUM: 136 mmol/L (ref 135–145)
Total Bilirubin: 0.9 mg/dL (ref 0.3–1.2)
Total Protein: 8 g/dL (ref 6.5–8.1)

## 2016-09-21 LAB — CBC
HCT: 46.7 % (ref 40.0–52.0)
HEMOGLOBIN: 15.9 g/dL (ref 13.0–18.0)
MCH: 30.8 pg (ref 26.0–34.0)
MCHC: 34 g/dL (ref 32.0–36.0)
MCV: 90.5 fL (ref 80.0–100.0)
Platelets: 232 10*3/uL (ref 150–440)
RBC: 5.16 MIL/uL (ref 4.40–5.90)
RDW: 13.5 % (ref 11.5–14.5)
WBC: 11.7 10*3/uL — AB (ref 3.8–10.6)

## 2016-09-21 LAB — URINALYSIS, COMPLETE (UACMP) WITH MICROSCOPIC
BILIRUBIN URINE: NEGATIVE
Bacteria, UA: NONE SEEN
Glucose, UA: NEGATIVE mg/dL
HGB URINE DIPSTICK: NEGATIVE
KETONES UR: NEGATIVE mg/dL
LEUKOCYTES UA: NEGATIVE
Nitrite: NEGATIVE
Protein, ur: NEGATIVE mg/dL
SPECIFIC GRAVITY, URINE: 1.023 (ref 1.005–1.030)
SQUAMOUS EPITHELIAL / LPF: NONE SEEN
pH: 7 (ref 5.0–8.0)

## 2016-09-21 LAB — LIPASE, BLOOD: LIPASE: 38 U/L (ref 11–51)

## 2016-09-21 MED ORDER — METRONIDAZOLE 500 MG PO TABS: 500.0000 mg | ORAL_TABLET | Freq: Two times a day (BID) | ORAL | 0 refills | Status: AC

## 2016-09-21 MED ORDER — CIPROFLOXACIN HCL 500 MG PO TABS
500.0000 mg | ORAL_TABLET | Freq: Two times a day (BID) | ORAL | 0 refills | Status: AC
Start: 2016-09-21 — End: ?

## 2016-09-21 MED ORDER — ONDANSETRON HCL 4 MG/2ML IJ SOLN
INTRAMUSCULAR | Status: AC
  Filled 2016-09-21: qty 2

## 2016-09-21 MED ORDER — ONDANSETRON HCL 4 MG/2ML IJ SOLN
4.0000 mg | Freq: Once | INTRAMUSCULAR | Status: AC
  Administered 2016-09-21: 4 mg via INTRAVENOUS

## 2016-09-21 MED ORDER — SODIUM CHLORIDE 0.9 % IV BOLUS (SEPSIS)
1000.0000 mL | Freq: Once | INTRAVENOUS | Status: AC
  Administered 2016-09-21: 1000 mL via INTRAVENOUS

## 2016-09-21 MED ORDER — ONDANSETRON 4 MG PO TBDP: 4.0000 mg | ORAL_TABLET | Freq: Three times a day (TID) | ORAL | 0 refills | Status: AC | PRN

## 2016-09-21 MED ORDER — IOPAMIDOL (ISOVUE-300) INJECTION 61%
30.0000 mL | Freq: Once | INTRAVENOUS | Status: AC | PRN
  Administered 2016-09-21: 30 mL via ORAL

## 2016-09-21 MED ORDER — IOPAMIDOL (ISOVUE-370) INJECTION 76%
100.0000 mL | Freq: Once | INTRAVENOUS | Status: AC | PRN
  Administered 2016-09-21: 100 mL via INTRAVENOUS

## 2016-09-21 MED ORDER — MORPHINE SULFATE (PF) 4 MG/ML IV SOLN
4.0000 mg | Freq: Once | INTRAVENOUS | Status: AC
  Administered 2016-09-21: 4 mg via INTRAVENOUS

## 2016-09-21 MED ORDER — MORPHINE SULFATE (PF) 4 MG/ML IV SOLN
INTRAVENOUS | Status: AC
  Filled 2016-09-21: qty 1

## 2016-09-21 NOTE — ED Provider Notes (Signed)
[facility] Emergency Department Provider Note   ____________________________________________    I have reviewed the triage vital signs and the nursing notes.   HISTORY  Chief Complaint Abdominal Pain and Vomiting     HPI Clinton Hanna is a 51 y.o. male who presents with complaints of abdominal pain. Patient reports gradual development of lower abdominal pain over the last several days which has now localized to the right lower quadrant and has become moderate to severe and cramping in nature. It does not radiate. He reports nausea and vomiting. No diarrhea. He has a history of a VP shunt but no other abdominal surgeries reported. He reports chills and diaphoresis. No history of diabetes.   Past Medical History:  Diagnosis Date  . Chronic kidney disease   . Glaucoma   . Glaucoma     Patient Active Problem List   Diagnosis Date Noted  . Left ureteral stone 10/06/2014    Past Surgical History:  Procedure Laterality Date  . COLONOSCOPY WITH PROPOFOL N/A 06/06/2016   Procedure: COLONOSCOPY WITH PROPOFOL;  Surgeon: Christena DeemMartin U Skulskie, MD;  [facility];  Service: Endoscopy;  Laterality: N/A;  . EYE SURGERY  1976   1983  . SHUNT REVISION  1977   1986  . TONSILLECTOMY      Prior to Admission medications   Medication Sig Start Date End Date Taking? Authorizing Provider  ciprofloxacin (CIPRO) 500 MG tablet Take 1 tablet (500 mg total) by mouth 2 (two) times daily. 09/21/16   Jene EveryKinner, Nilo Fallin, MD  metroNIDAZOLE (FLAGYL) 500 MG tablet Take 1 tablet (500 mg total) by mouth 2 (two) times daily after a meal. 09/21/16   Jene EveryKinner, Rechelle Niebla, MD  ondansetron (ZOFRAN ODT) 4 MG disintegrating tablet Take 1 tablet (4 mg total) by mouth every 8 (eight) hours as needed for nausea or vomiting. 09/21/16   Jene EveryKinner, Lilliauna Van, MD  oxyCODONE-acetaminophen (ROXICET) 5-325 MG per tablet Take 1-2 tablets by mouth every 6 (six) hours as needed. Patient not taking: Reported  on 10/06/2014 09/13/14   Schaevitz, Myra Rudeavid Matthew, MD     Allergies Patient has no known allergies.  Family History  Problem Relation Age of Onset  . Kidney cancer Neg Hx   . Prostate cancer Neg Hx   . Bladder Cancer Neg Hx     Social History Social History  Substance Use Topics  . Smoking status: Never Smoker  . Smokeless tobacco: Never Used  . Alcohol use Yes     Comment: occasionaly    Review of Systems  Constitutional: No fever/chills Eyes: No visual changes.  ENT: No sore throat. Cardiovascular: Denies chest pain. Respiratory: Denies shortness of breath. Gastrointestinal: As above Genitourinary: Negative for dysuria. Musculoskeletal: Negative for back pain. Skin: Negative for rash. Neurological: Negative for headaches or weakness   ____________________________________________   PHYSICAL EXAM:  VITAL SIGNS: ED Triage Vitals [09/21/16 0904]  Enc Vitals Group     BP 115/87     Pulse Rate (!) 115     Resp 18     Temp 98.5 F (36.9 C)     Temp Source Oral     SpO2 98 %     Weight 108.9 kg (240 lb)     Height 1.803 m (5\' 11" )     Head Circumference      Peak Flow      Pain Score 4     Pain Loc      Pain Edu?  Excl. in GC?     Constitutional: Alert and oriented.  Pleasant and interactive. Diaphoretic  Nose: No congestion/rhinnorhea. Mouth/Throat: Mucous membranes are moist.    Cardiovascular: Tachycardia, regular rhythm. Grossly normal heart sounds.  Good peripheral circulation. Respiratory: Normal respiratory effort.  No retractions. Lungs CTAB. Gastrointestinal: Tenderness to palpation right lower quadrant, moderate. No distention.  No CVA tenderness. Genitourinary: deferred Musculoskeletal:  Warm and well perfused Neurologic:  Normal speech and language. No gross focal neurologic deficits are appreciated.  Skin:  Skin is warm, dry and intact. No rash noted. Psychiatric: Mood and affect are normal. Speech and behavior are  normal.  ____________________________________________   LABS (all labs ordered are listed, but only abnormal results are displayed)  Labs Reviewed  COMPREHENSIVE METABOLIC PANEL - Abnormal; Notable for the following:       Result Value   Glucose, Bld 124 (*)    All other components within normal limits  CBC - Abnormal; Notable for the following:    WBC 11.7 (*)    All other components within normal limits  URINALYSIS, COMPLETE (UACMP) WITH MICROSCOPIC - Abnormal; Notable for the following:    Color, Urine YELLOW (*)    APPearance HAZY (*)    All other components within normal limits  LIPASE, BLOOD   ____________________________________________  EKG  None ____________________________________________  RADIOLOGY  CT demonstrates questionable enteritis, normal appendix ____________________________________________   PROCEDURES  Procedure(s) performed: No    Critical Care performed: No ____________________________________________   INITIAL IMPRESSION / ASSESSMENT AND PLAN / ED COURSE  Pertinent labs & imaging results that were available during my care of the patient were reviewed by me and considered in my medical decision making (see chart for details).  Patient presents with abdominal pain. Pain started more globally and then localized to the right lower quadrant, concern for appendicitis. Patient treated with IV morphine and IV Zofran, pending CT abdomen pelvis  The patient felt markedly better after treatment. CT scan showed normal appendix possible enteritis. Lab work is overall reassuring. Discussed with patient and he would prefer to go home, he agrees to return if pain returns or if new symptoms develop. Family agrees with this plan as well    ____________________________________________   FINAL CLINICAL IMPRESSION(S) / ED DIAGNOSES  Final diagnoses:  Enteritis  Right lower quadrant abdominal pain      NEW MEDICATIONS STARTED DURING THIS  VISIT:  Discharge Medication List as of 09/21/2016 12:26 PM    START taking these medications   Details  ciprofloxacin (CIPRO) 500 MG tablet Take 1 tablet (500 mg total) by mouth 2 (two) times daily., Starting Wed 09/21/2016, Print    metroNIDAZOLE (FLAGYL) 500 MG tablet Take 1 tablet (500 mg total) by mouth 2 (two) times daily after a meal., Starting Wed 09/21/2016, Print    ondansetron (ZOFRAN ODT) 4 MG disintegrating tablet Take 1 tablet (4 mg total) by mouth every 8 (eight) hours as needed for nausea or vomiting., Starting Wed 09/21/2016, Print         Note:  This document was prepared using Dragon voice recognition software and may include unintentional dictation errors.    Jene Every, MD 09/21/16 [phone]

## 2016-09-21 NOTE — ED Notes (Signed)
Pt has finished CT contrast, CT notified 

## 2016-09-21 NOTE — ED Notes (Signed)
This RN to bedside due to call bell, pt noted to be 89% on RA after admin of morphine, pt placed on 1L of O2 via [place], O2 94% on RA. MD aware. Explained to patient and his SO at bedside, states understanding.

## 2016-09-21 NOTE — ED Triage Notes (Signed)
States RLQ abd pain that began on Thursday that has gotten worse, states he took laxative thinking he was constipated but no relief, states 2 episodes of vomiting since last night, at present pt awake and alert in no acute distress

## 2016-09-30 ENCOUNTER — Ambulatory Visit
Admission: RE | Admit: 2016-09-30 | Discharge: 2016-09-30 | Disposition: A | Discharge status: Home / Self Care | Payer: BLUE CROSS/BLUE SHIELD | Source: Ambulatory Visit | Attending: Gastroenterology | Admitting: Gastroenterology

## 2016-09-30 DIAGNOSIS — Z860100 Personal history of colon polyps, unspecified: Secondary | ICD-10-CM

## 2019-05-23 IMAGING — CT CT VIRTUAL COLONOSCOPY DIAGNOSTIC
3 of 7 series · 12 of 36 positions shown, 18 images · non-contrast
Comparison: 09/21/2016

CLINICAL DATA: History of colonic polyps. Unsuccessful colonoscopy.

EXAM:
CT VIRTUAL COLONOSCOPY DIAGNOSTIC
TECHNIQUE: The patient was given a standard bowel preparation with Gastrografin
and barium for fluid and stool tagging respectively. The quality of
the bowel preparation is moderate. Automated CO2 insufflation of the
colon was performed prior to image acquisition and colonic
distention is moderate. Image post processing was used to generate a
3D endoluminal fly-through projection of the colon and to
electronically subtract stool/fluid as appropriate.

[Series 6: prone (id) · axial · 0.85mm/px · z∈[-415,+92]mm · 8 of 524 slices shown, 13 images (1 of 2)]
[im 59/524  soft-tissue]
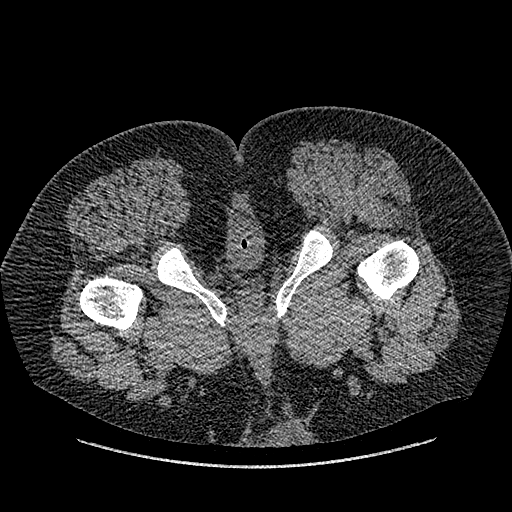
[im 59/524  bone]
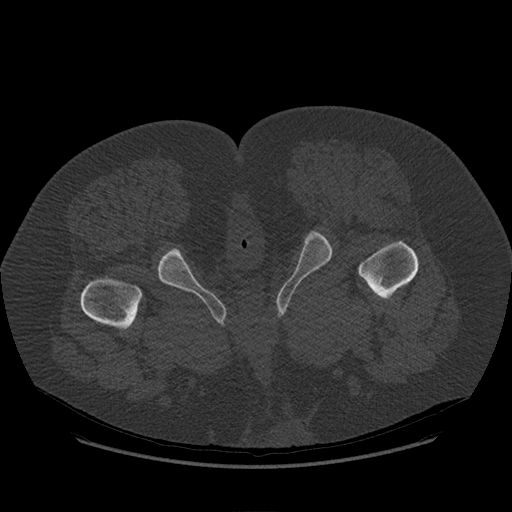
[im 117/524  soft-tissue]
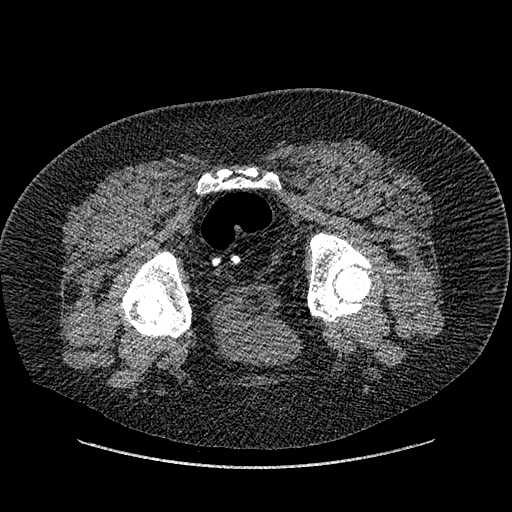
[im 175/524  soft-tissue]
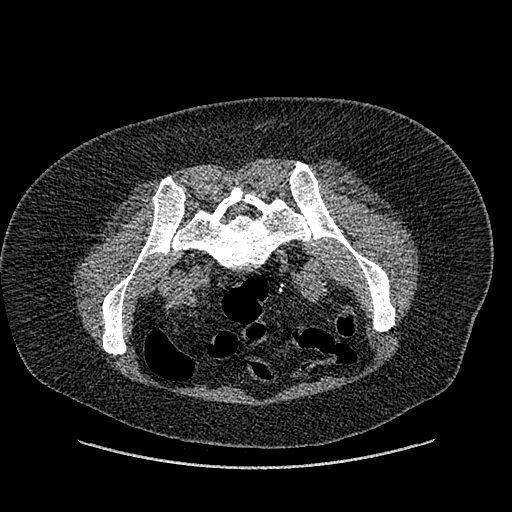
[im 233/524  soft-tissue]
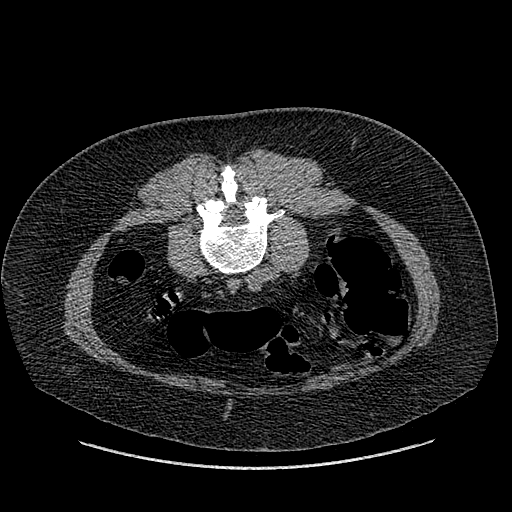
[im 291/524  soft-tissue]
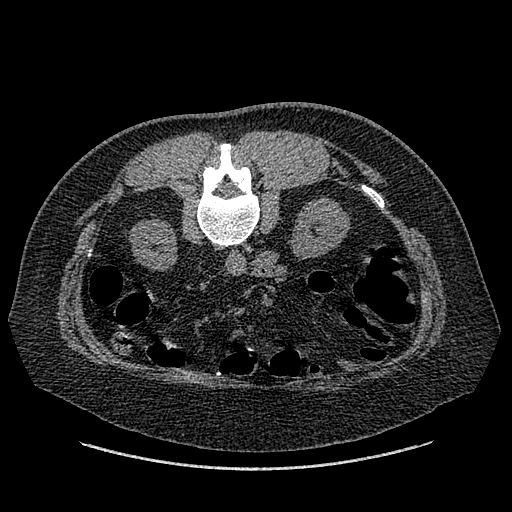
[im 291/524  lung]
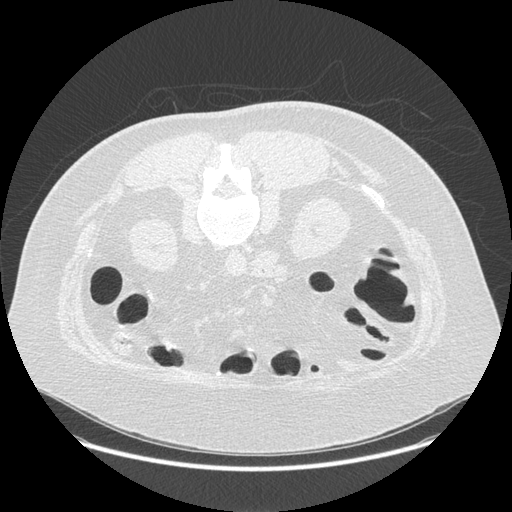
[im 349/524  soft-tissue]
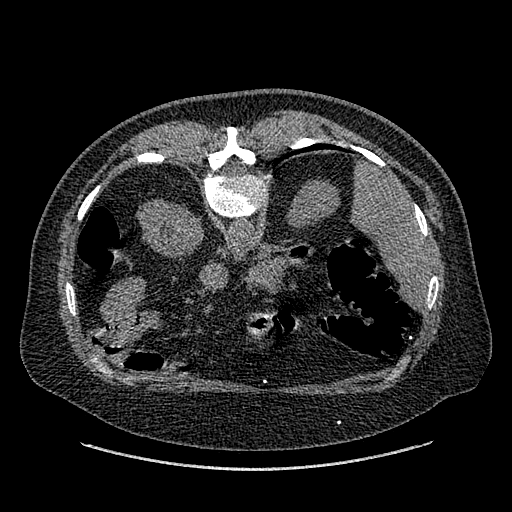
[im 349/524  lung]
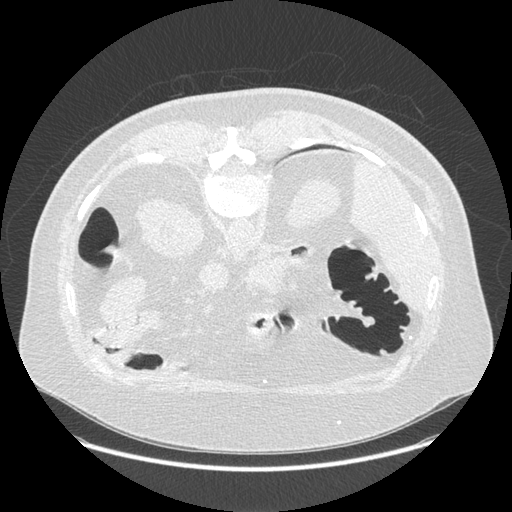
[im 407/524  soft-tissue]
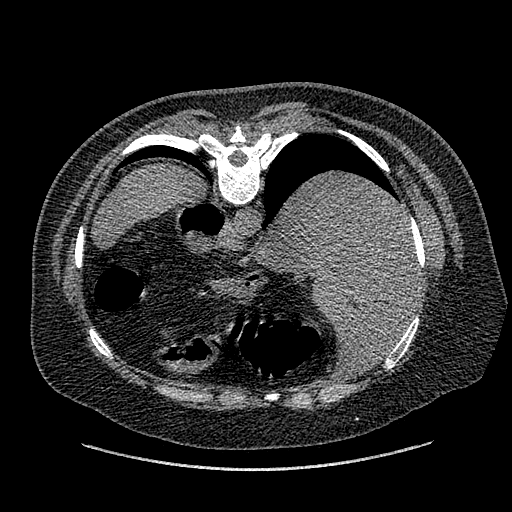
[im 407/524  lung]
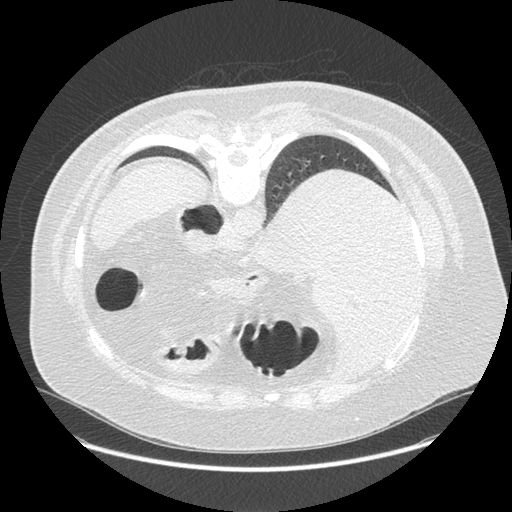
[im 465/524  soft-tissue]
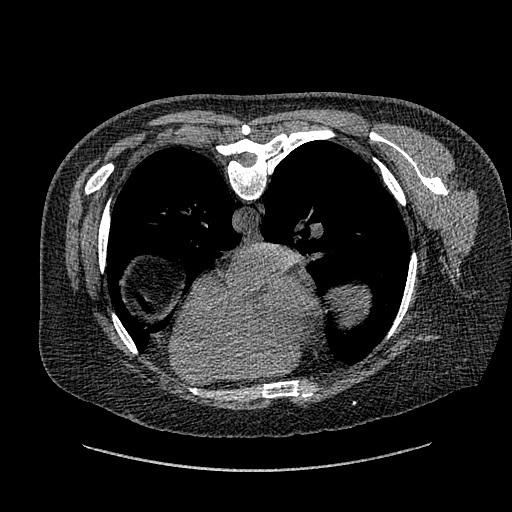
[im 465/524  lung]
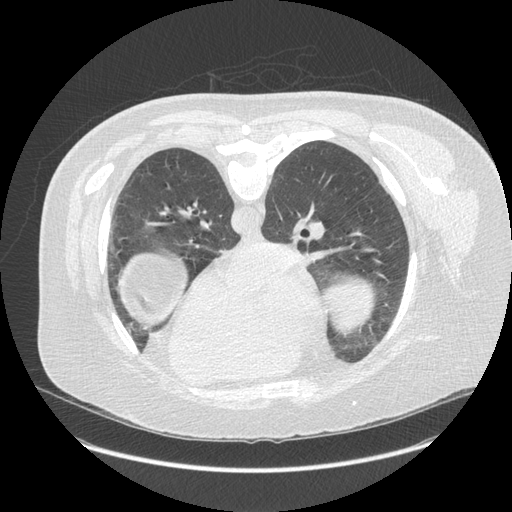

[Series 7: prone (id) · axial · 0.85mm/px · z∈[-324,+1]mm · 3 of 262 slices shown (2 of 2)]
[im 66/262  soft-tissue]
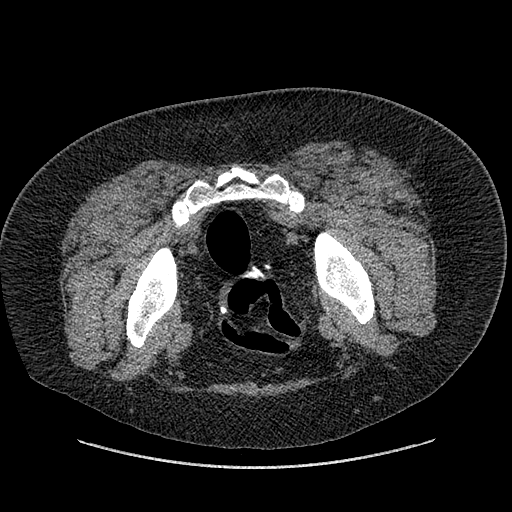
[im 131/262  soft-tissue]
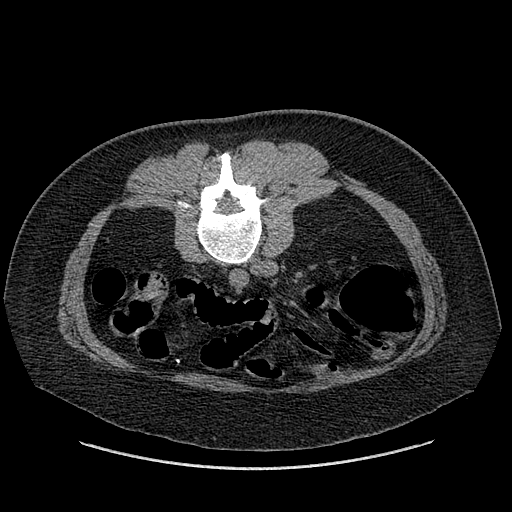
[im 196/262  soft-tissue]
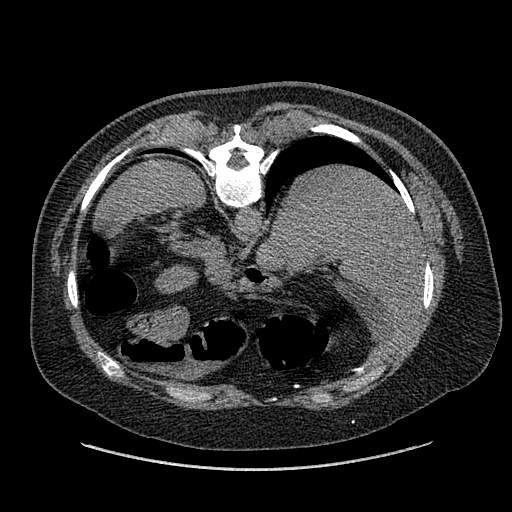

[Series 601: coronal body · coronal · 1.09mm/px · 1 of 142 slices shown, 2 images]
[im 48/142  soft-tissue]
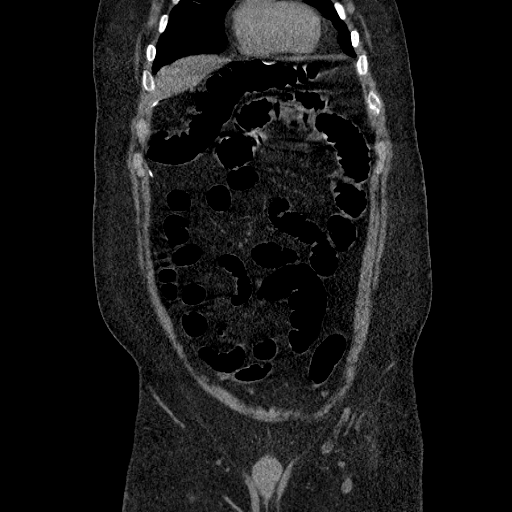
[im 48/142  bone]
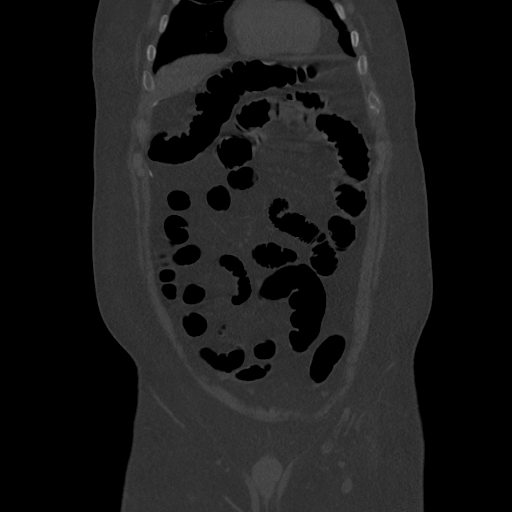

[12 of 36 positions shown; findings below may reference images not displayed]

FINDINGS: VIRTUAL COLONOSCOPY

No fixed polypoid filling defects lower annular constricting
lesions.

Virtual colonoscopy is not designed to detect diminutive polyps
(i.e., less than or equal to 5 mm), the presence or absence of which
may not affect clinical management.

CT ABDOMEN AND PELVIS WITHOUT CONTRAST

Lower chest: No acute abnormality.

Hepatobiliary: No focal hepatic abnormality. Gallbladder
unremarkable.

Pancreas: No focal abnormality or ductal dilatation.

Spleen: No focal abnormality.  Normal size.

Adrenals/Urinary Tract: Bilateral renal parapelvic cysts. Adrenal
glands and urinary bladder unremarkable.

Stomach/Bowel: Stomach and small bowel decompressed, unremarkable.
Previously seen inflammation around the distal small bowel has
resolved.

Vascular/Lymphatic: No evidence of aneurysm or adenopathy.

Reproductive:  No visible focal abnormality.

Other: No free fluid or free air. Disconnected portion of VP shunt
tubing noted in the pelvis with the remainder of the VP shunt is
noted in the anterior upper abdomen.

Musculoskeletal: No acute bony abnormality.
IMPRESSION: No fixed polypoid filling defects or annular constricting lesions
within the colon.

Interval resolution of the previously seen distal small bowel wall
thickening.

Bilateral renal parapelvic cysts.

Disconnected VP shunt tube the noted in the pelvis, unchanged.
